# Patient Record
Sex: Female | Born: 1950 | Race: White | Hispanic: No | Marital: Married | Smoking: Never smoker
Health system: Southern US, Community
[De-identification: ages and names within clinical notes are randomized; demographics above are authoritative.]

## PROBLEM LIST (undated history)

## (undated) DIAGNOSIS — F419 Anxiety disorder, unspecified: Secondary | ICD-10-CM

## (undated) DIAGNOSIS — I10 Essential (primary) hypertension: Secondary | ICD-10-CM

## (undated) DIAGNOSIS — F329 Major depressive disorder, single episode, unspecified: Secondary | ICD-10-CM

## (undated) DIAGNOSIS — F32A Depression, unspecified: Secondary | ICD-10-CM

## (undated) HISTORY — PX: CHOLECYSTECTOMY: SHX55

## (undated) HISTORY — PX: TUBAL LIGATION: SHX77

---

## 2001-04-30 ENCOUNTER — Other Ambulatory Visit: Admission: RE | Admit: 2001-04-30 | Discharge: 2001-04-30 | Payer: Self-pay | Admitting: Obstetrics and Gynecology

## 2003-01-26 ENCOUNTER — Emergency Department (HOSPITAL_COMMUNITY): Admission: EM | Admit: 2003-01-26 | Discharge: 2003-01-26 | Payer: Self-pay | Admitting: Emergency Medicine

## 2003-04-14 ENCOUNTER — Emergency Department (HOSPITAL_COMMUNITY): Admission: EM | Admit: 2003-04-14 | Discharge: 2003-04-14 | Payer: Self-pay | Admitting: Emergency Medicine

## 2003-04-19 ENCOUNTER — Emergency Department (HOSPITAL_COMMUNITY): Admission: RE | Admit: 2003-04-19 | Discharge: 2003-04-19 | Payer: Self-pay | Admitting: Internal Medicine

## 2004-09-12 ENCOUNTER — Emergency Department: Payer: Self-pay | Admitting: Emergency Medicine

## 2004-11-13 ENCOUNTER — Other Ambulatory Visit: Payer: Self-pay

## 2004-11-13 ENCOUNTER — Emergency Department: Payer: Self-pay | Admitting: Emergency Medicine

## 2005-01-08 ENCOUNTER — Emergency Department: Payer: Self-pay | Admitting: Emergency Medicine

## 2005-01-08 ENCOUNTER — Other Ambulatory Visit: Payer: Self-pay

## 2005-07-24 ENCOUNTER — Emergency Department: Payer: Self-pay | Admitting: Emergency Medicine

## 2005-11-03 ENCOUNTER — Ambulatory Visit: Payer: Self-pay | Admitting: *Deleted

## 2006-12-08 ENCOUNTER — Emergency Department (HOSPITAL_COMMUNITY): Admission: EM | Admit: 2006-12-08 | Discharge: 2006-12-08 | Payer: Self-pay | Admitting: Emergency Medicine

## 2007-06-03 ENCOUNTER — Emergency Department (HOSPITAL_COMMUNITY): Admission: EM | Admit: 2007-06-03 | Discharge: 2007-06-03 | Payer: Self-pay | Admitting: Emergency Medicine

## 2007-11-08 ENCOUNTER — Emergency Department (HOSPITAL_COMMUNITY): Admission: EM | Admit: 2007-11-08 | Discharge: 2007-11-08 | Payer: Self-pay | Admitting: Emergency Medicine

## 2008-03-26 IMAGING — CR DG WRIST COMPLETE 3+V*L*
2 series · 2 of 2 positions shown · non-contrast
Comparison: none

CLINICAL DATA: Left wrist injury.  Injured four days ago.
LEFT WRIST ? 4 VIEW:

[view not recorded (1 of 2)]
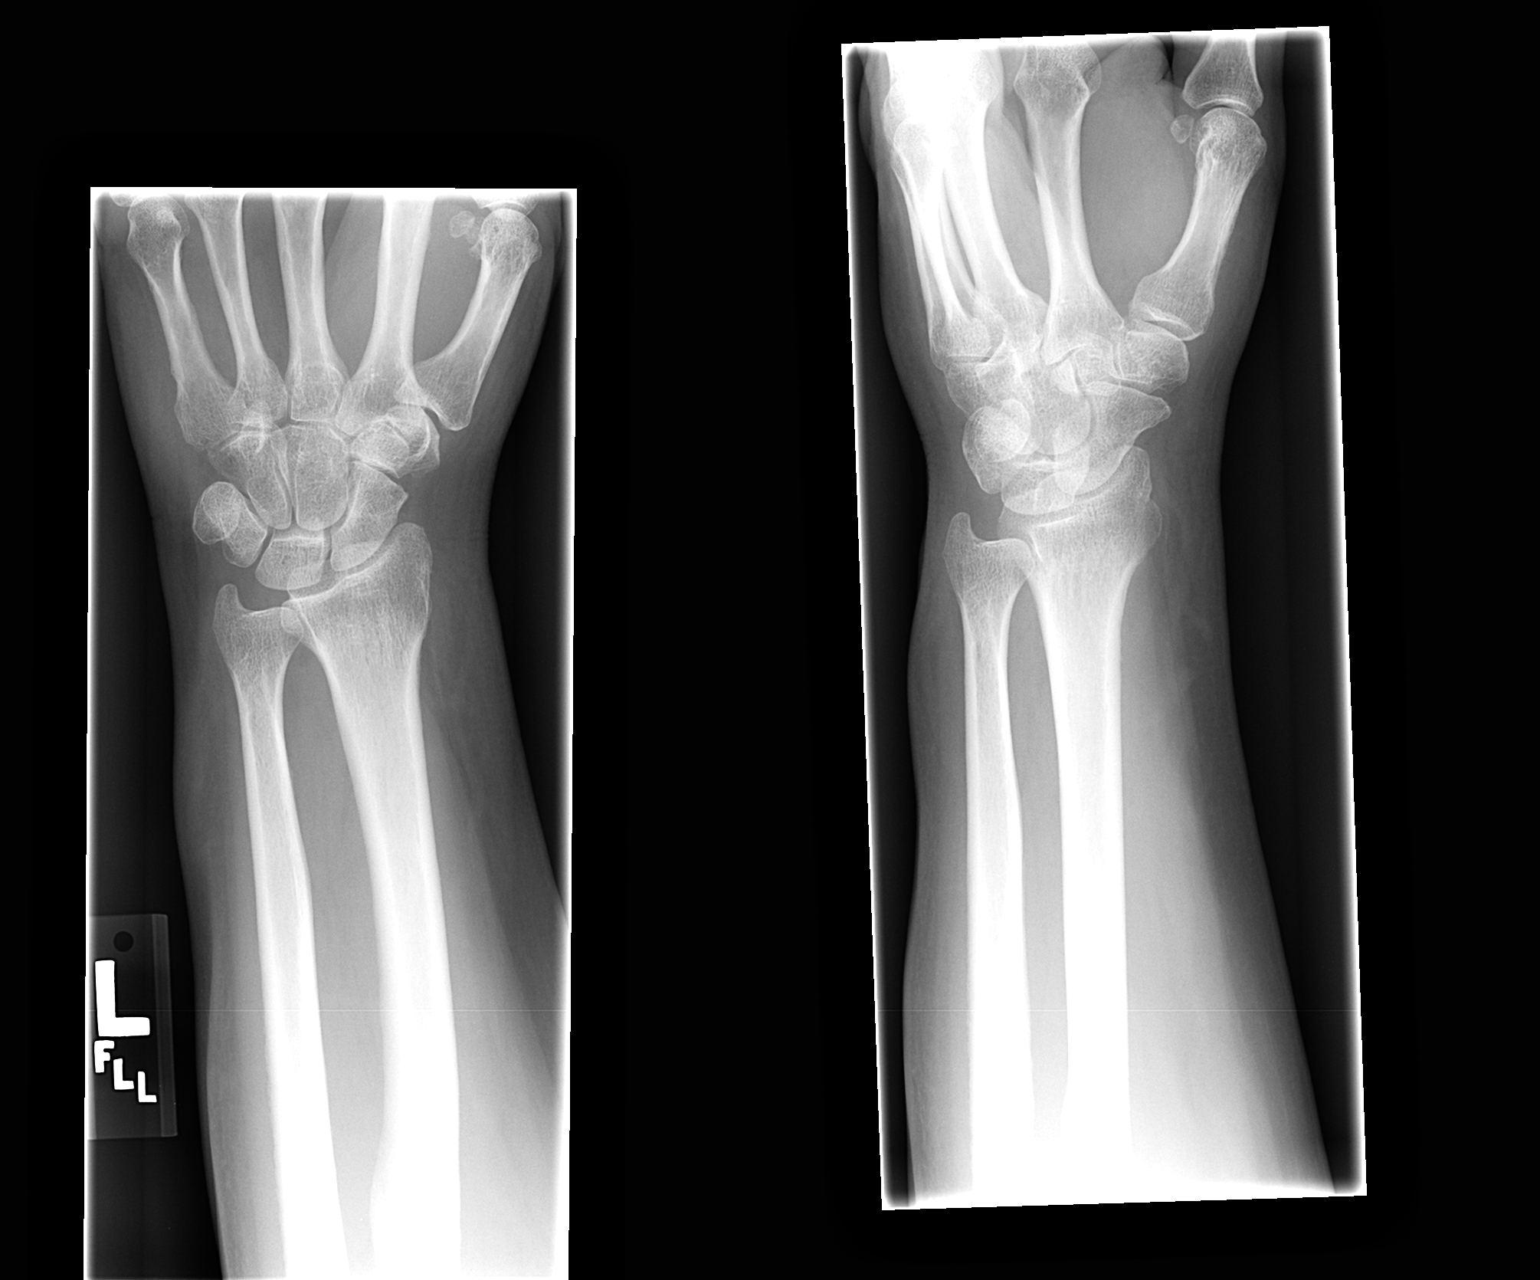

[view not recorded (2 of 2)]
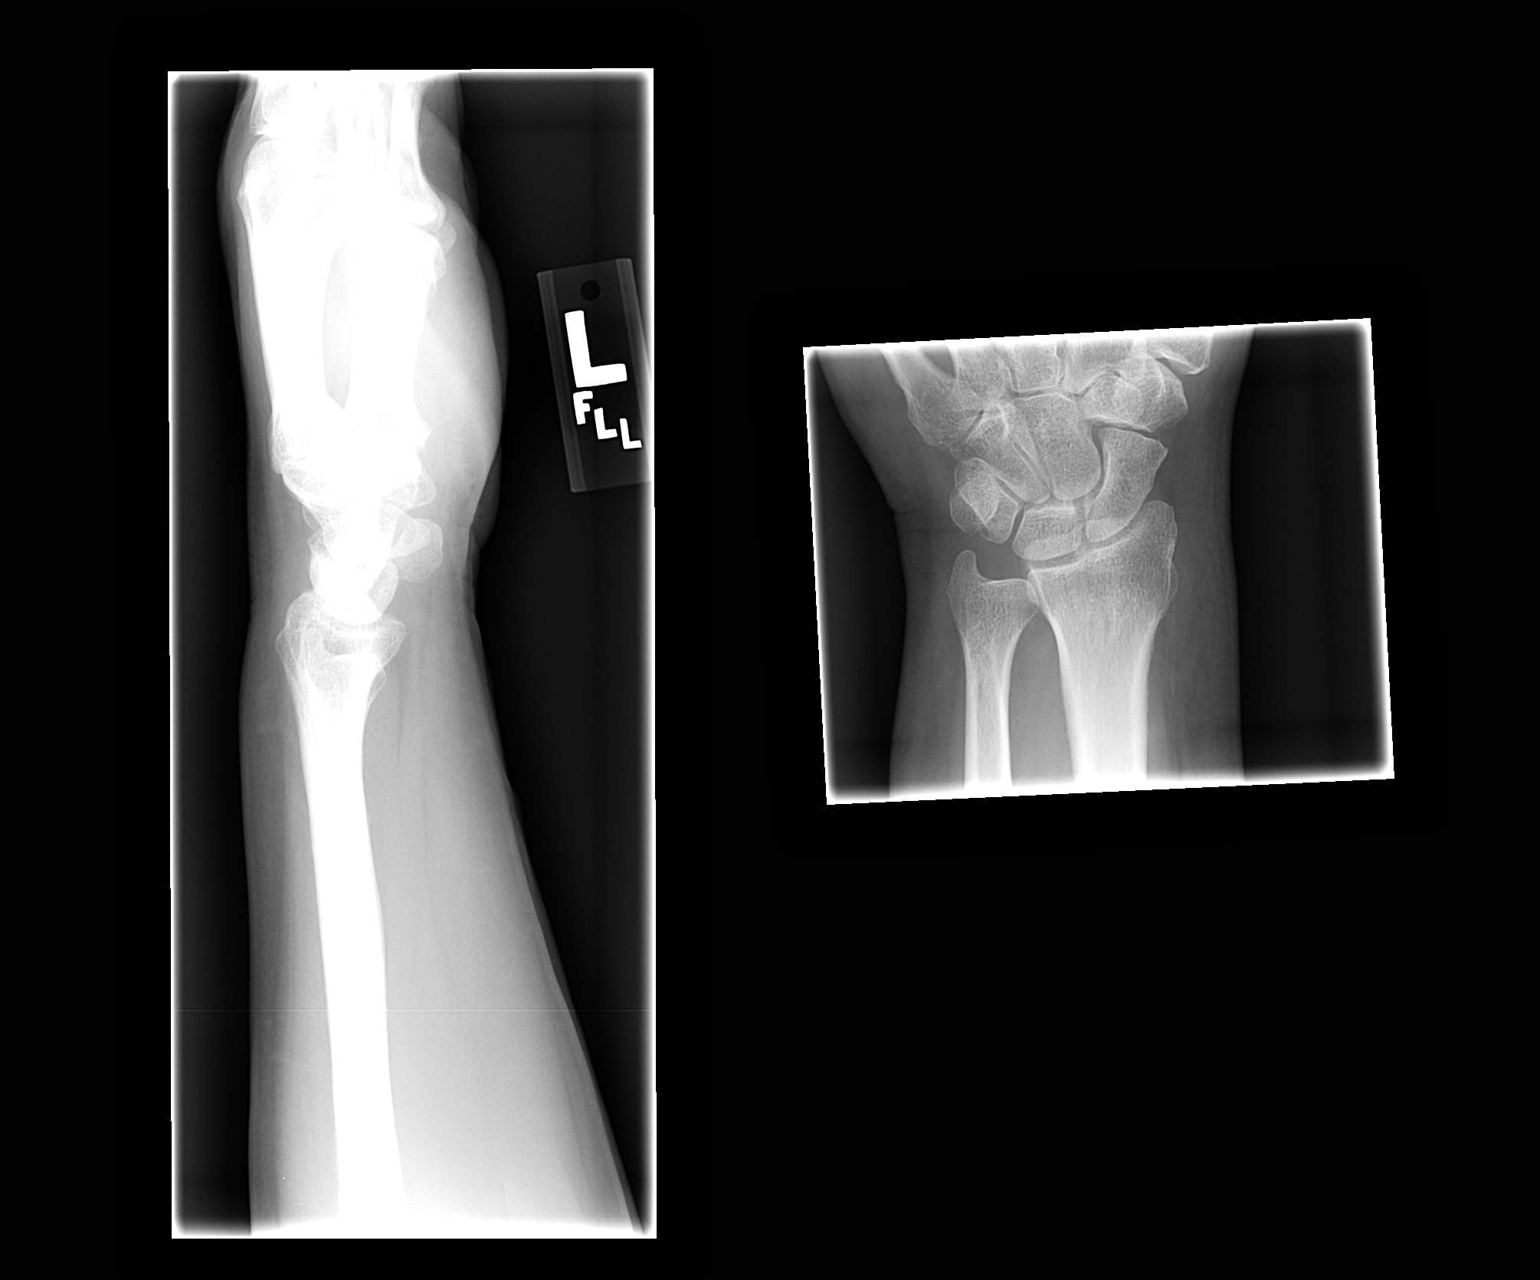

[2 of 2 positions shown; findings below may reference images not displayed]

FINDINGS: Four views of the left wrist demonstrate no acute fracture.  There is mild prominence of the pronator fat pad.  Particular attention is directed to the navicular.
IMPRESSION: No evidence of acute fracture.  Recommend followup plain films of the left wrist in 4-6 days if continued pain.

## 2010-01-11 ENCOUNTER — Emergency Department: Payer: Self-pay | Admitting: Emergency Medicine

## 2010-04-21 ENCOUNTER — Encounter: Payer: Self-pay | Admitting: Obstetrics & Gynecology

## 2010-04-21 ENCOUNTER — Encounter: Payer: Self-pay | Admitting: Internal Medicine

## 2010-05-29 ENCOUNTER — Emergency Department: Payer: Self-pay | Admitting: Emergency Medicine

## 2010-11-20 ENCOUNTER — Ambulatory Visit: Payer: Self-pay | Admitting: Internal Medicine

## 2012-03-08 IMAGING — US US THYROID
1 series · 17 of 25 positions shown · non-contrast
Comparison: none

REASON FOR EXAM: Hyperthyroidism
COMMENTS:

[Series 1: us thyroid · 17 of 43 slices shown]
[im 1/43]
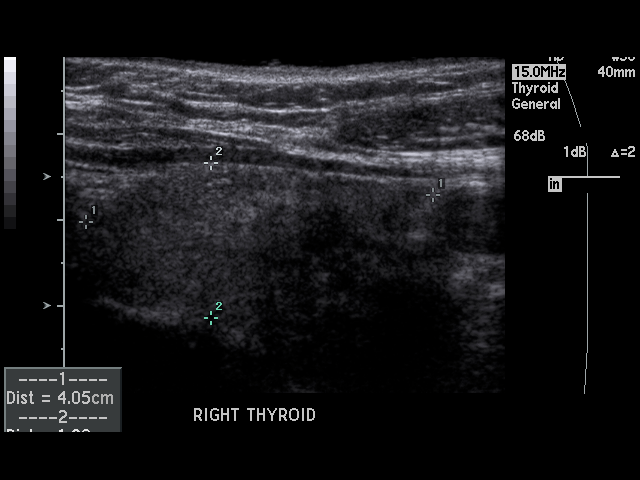
[im 4/43]
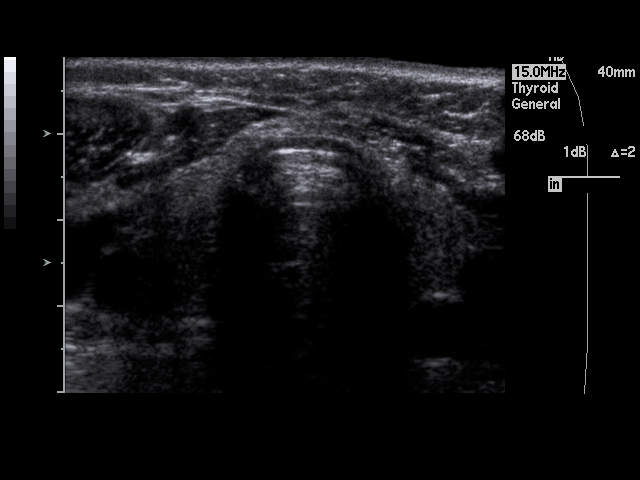
[im 6/43]
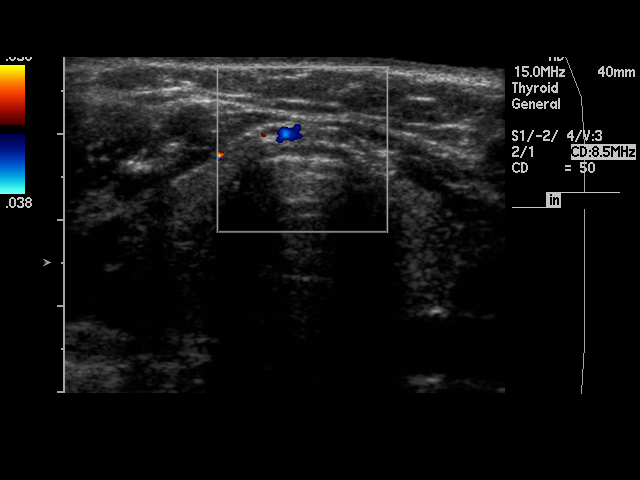
[im 9/43]
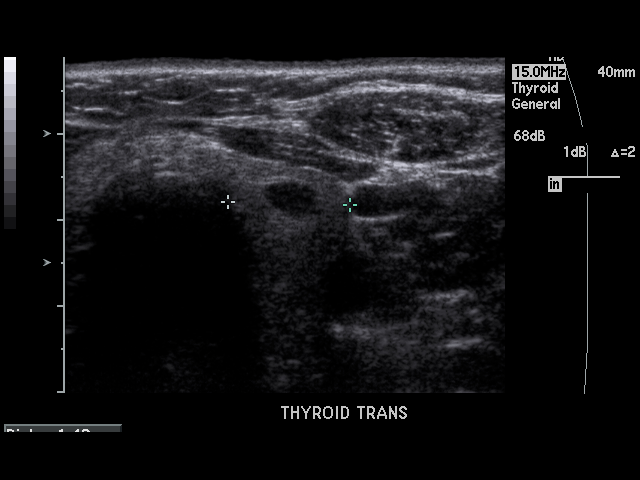
[im 11/43]
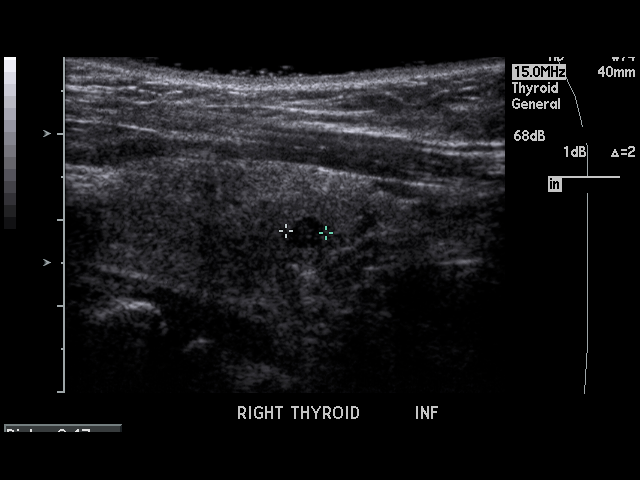
[im 15/43]
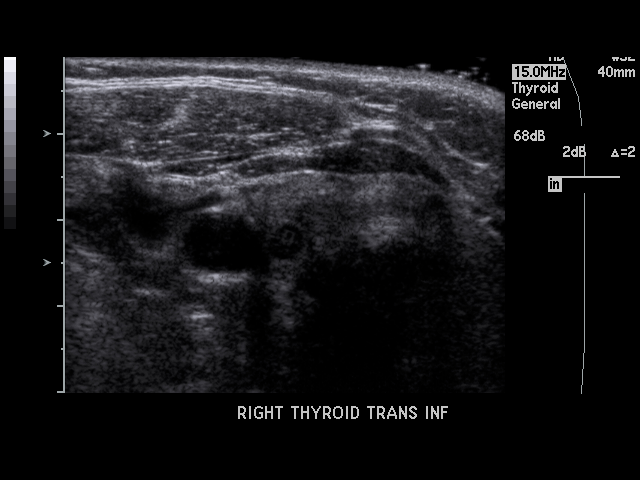
[im 16/43]
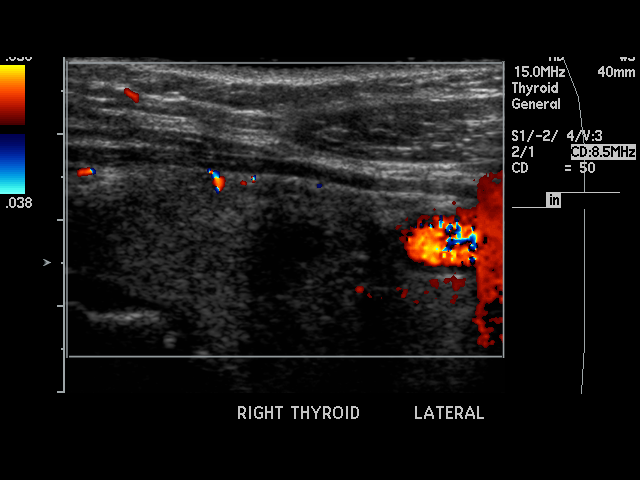
[im 20/43]
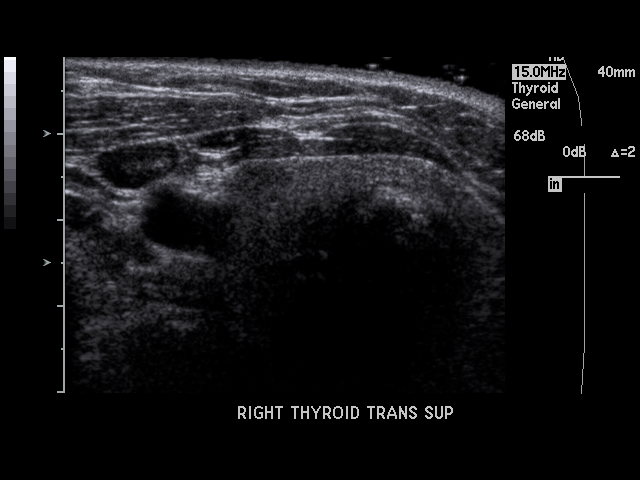
[im 22/43]
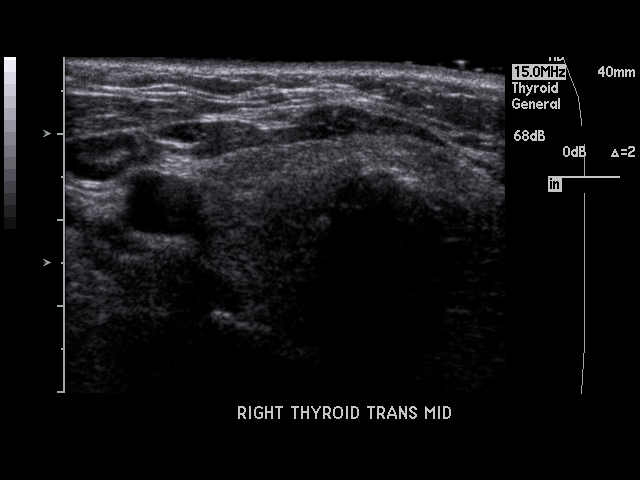
[im 23/43]
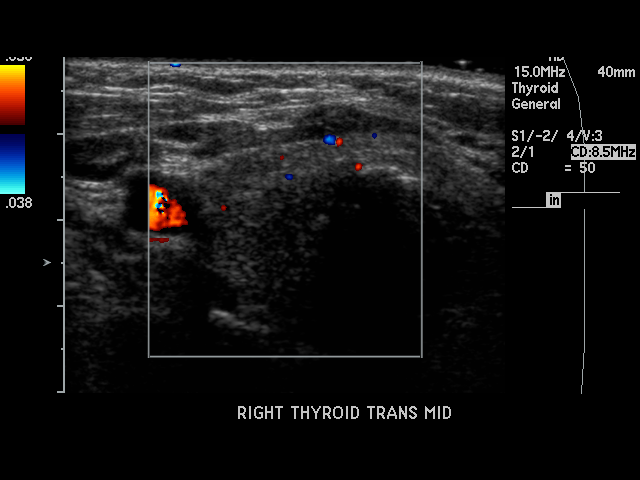
[im 27/43]
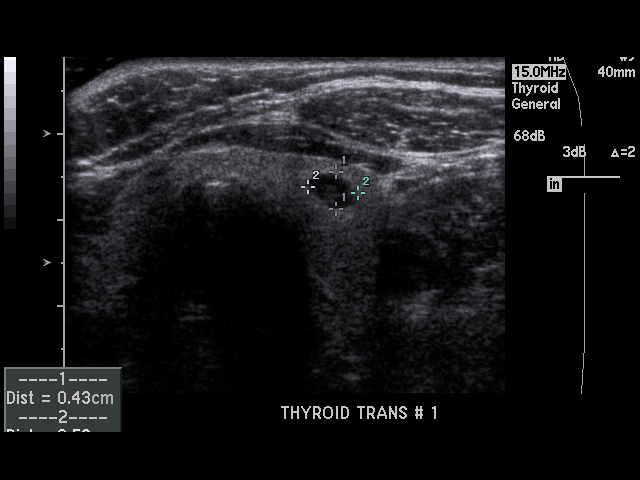
[im 29/43]
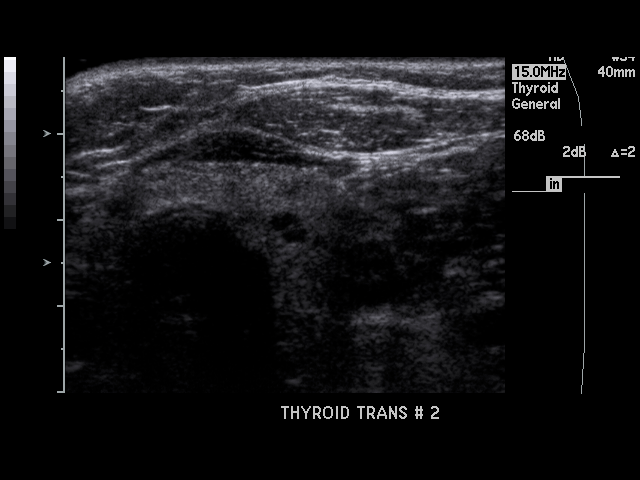
[im 32/43]
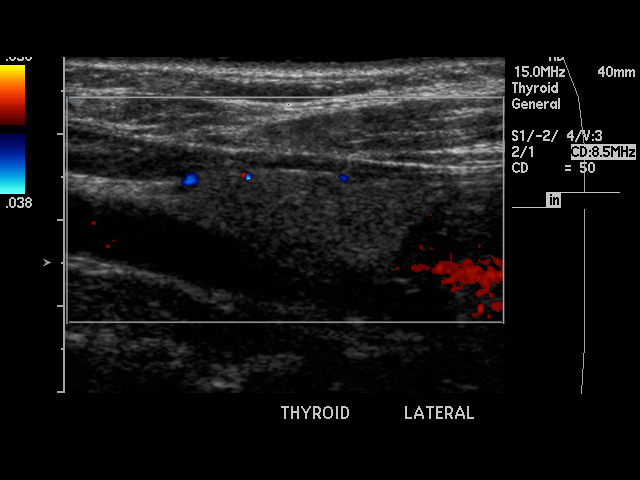
[im 34/43]
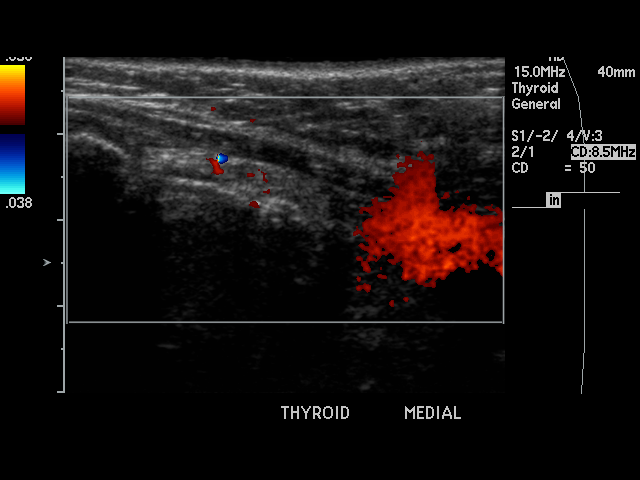
[im 37/43]
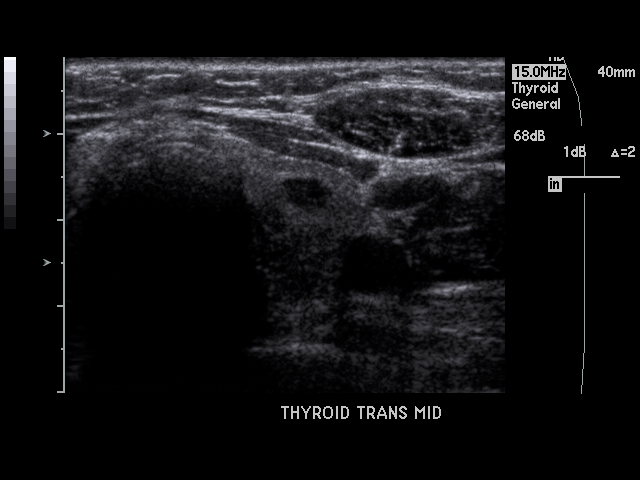
[im 39/43]
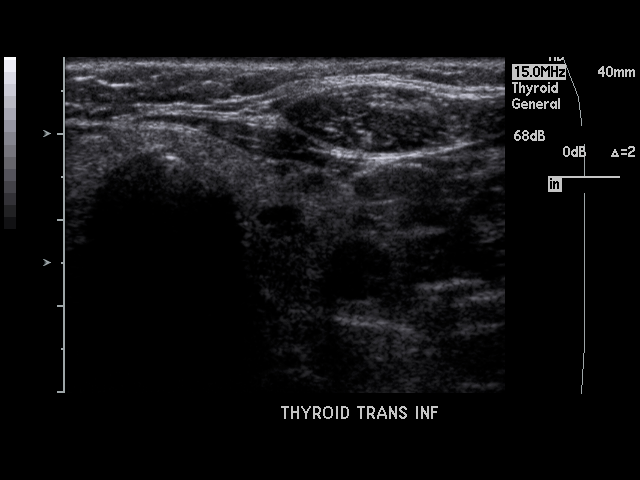
[im 43/43]
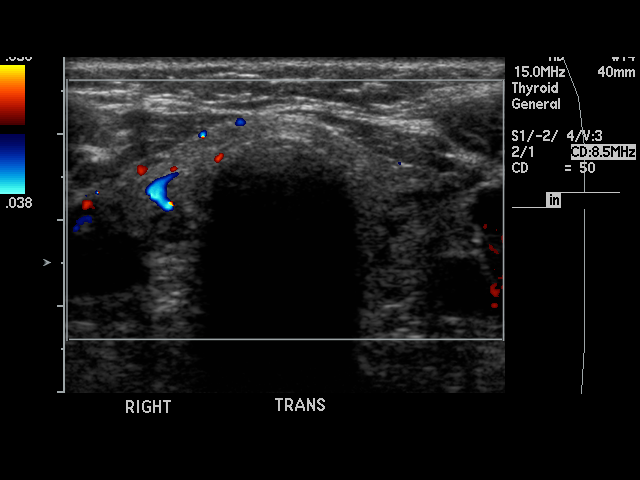

[17 of 25 positions shown; findings below may reference images not displayed]

PROCEDURE:     LETLHABILE - LETLHABILE THYROID  - November 20, 2010  [DATE]

RESULT:     The right lobe of the thyroid measures 4.05 cm x 1.8 cm x
cm and the left lobe measures 3.54 cm x 1.43 cm x 1.42 cm. In the lower pole
of the right lobe, there is a 4.7 mm, smoothly marginated, hypoechoic
nodule. In the left lobe, there are noted two, adjacent, smoothly
marginated, hypoechoic nodules near the junction of the mid pole and lower
pole. The larger measures 6.1 mm in diameter and the smaller measures 4.9 mm
in diameter. No additional masses or nodules are seen. No thyroid
calcifications are identified.
IMPRESSION: There are noted subcentimeter hypoechoic, smoothly marginated
nodules bilaterally as noted above.

## 2012-10-05 ENCOUNTER — Emergency Department: Payer: Self-pay | Admitting: Unknown Physician Specialty

## 2012-10-05 LAB — URINALYSIS, COMPLETE
Bacteria: NONE SEEN
Bilirubin,UR: NEGATIVE
Glucose,UR: NEGATIVE mg/dL (ref 0–75)
Leukocyte Esterase: NEGATIVE
Nitrite: NEGATIVE
Ph: 6 (ref 4.5–8.0)
Protein: NEGATIVE
RBC,UR: 1 /HPF (ref 0–5)
Specific Gravity: 1.006 (ref 1.003–1.030)
WBC UR: <1 /HPF (ref 0–5)

## 2012-10-05 LAB — CBC WITH DIFFERENTIAL/PLATELET
Basophil #: 0.1 10*3/uL (ref 0.0–0.1)
Basophil %: 0.8 %
Eosinophil %: 1.8 %
HCT: 43.6 % (ref 35.0–47.0)
Lymphocyte #: 2.9 10*3/uL (ref 1.0–3.6)
Lymphocyte %: 27.3 %
MCH: 30 pg (ref 26.0–34.0)
MCHC: 34.4 g/dL (ref 32.0–36.0)
Monocyte #: 0.8 x10 3/mm (ref 0.2–0.9)
Monocyte %: 7.7 %
Neutrophil #: 6.6 10*3/uL — ABNORMAL HIGH (ref 1.4–6.5)
Neutrophil %: 62.4 %
RBC: 5 10*6/uL (ref 3.80–5.20)
RDW: 14.7 % — ABNORMAL HIGH (ref 11.5–14.5)
WBC: 10.5 10*3/uL (ref 3.6–11.0)

## 2012-10-05 LAB — MAGNESIUM: Magnesium: 1.8 mg/dL

## 2012-10-05 LAB — TSH: Thyroid Stimulating Horm: 1.61 u[IU]/mL

## 2012-10-05 LAB — COMPREHENSIVE METABOLIC PANEL
Alkaline Phosphatase: 106 U/L (ref 50–136)
Anion Gap: 7 (ref 7–16)
BUN: 7 mg/dL (ref 7–18)
Bilirubin,Total: 1.3 mg/dL — ABNORMAL HIGH (ref 0.2–1.0)
Calcium, Total: 9.2 mg/dL (ref 8.5–10.1)
Co2: 27 mmol/L (ref 21–32)
Creatinine: 0.57 mg/dL — ABNORMAL LOW (ref 0.60–1.30)
EGFR (African American): >60
EGFR (Non-African Amer.): >60
Glucose: 112 mg/dL — ABNORMAL HIGH (ref 65–99)
Osmolality: 274 (ref 275–301)
Potassium: 3.9 mmol/L (ref 3.5–5.1)
SGOT(AST): 36 U/L (ref 15–37)
SGPT (ALT): 44 U/L (ref 12–78)
Total Protein: 8.5 g/dL — ABNORMAL HIGH (ref 6.4–8.2)

## 2013-04-04 ENCOUNTER — Emergency Department: Payer: Self-pay | Admitting: Emergency Medicine

## 2014-07-22 IMAGING — CR DG CHEST 2V
1 series · 2 of 2 positions shown · non-contrast
Comparison: 10/05/2012

CLINICAL DATA: Productive cough for 1 week

EXAM:
CHEST  2 VIEW

[Series 1: w chest pa · 0.14mm/px · 2 of 2 slices shown]
[im 1/2]
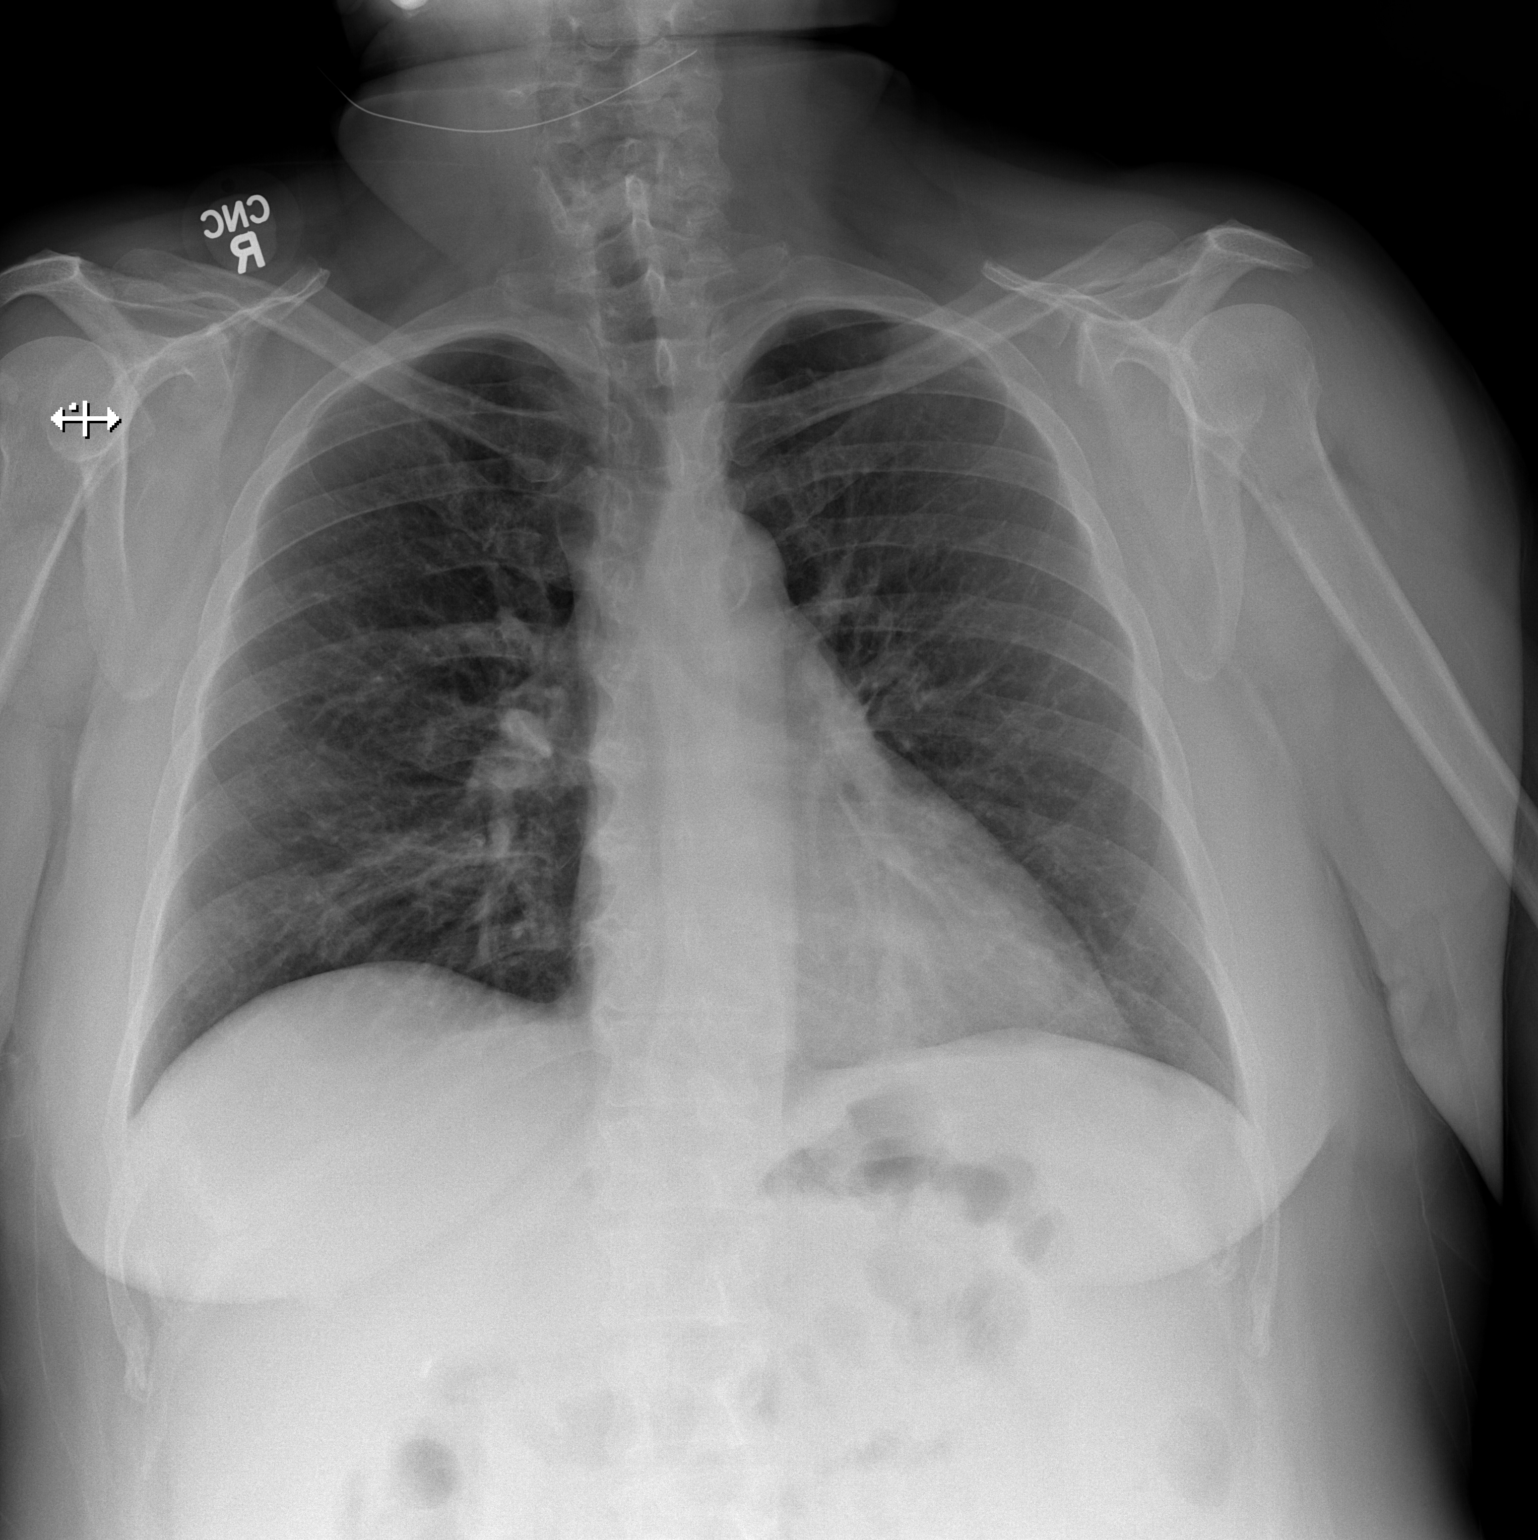
[im 2/2]
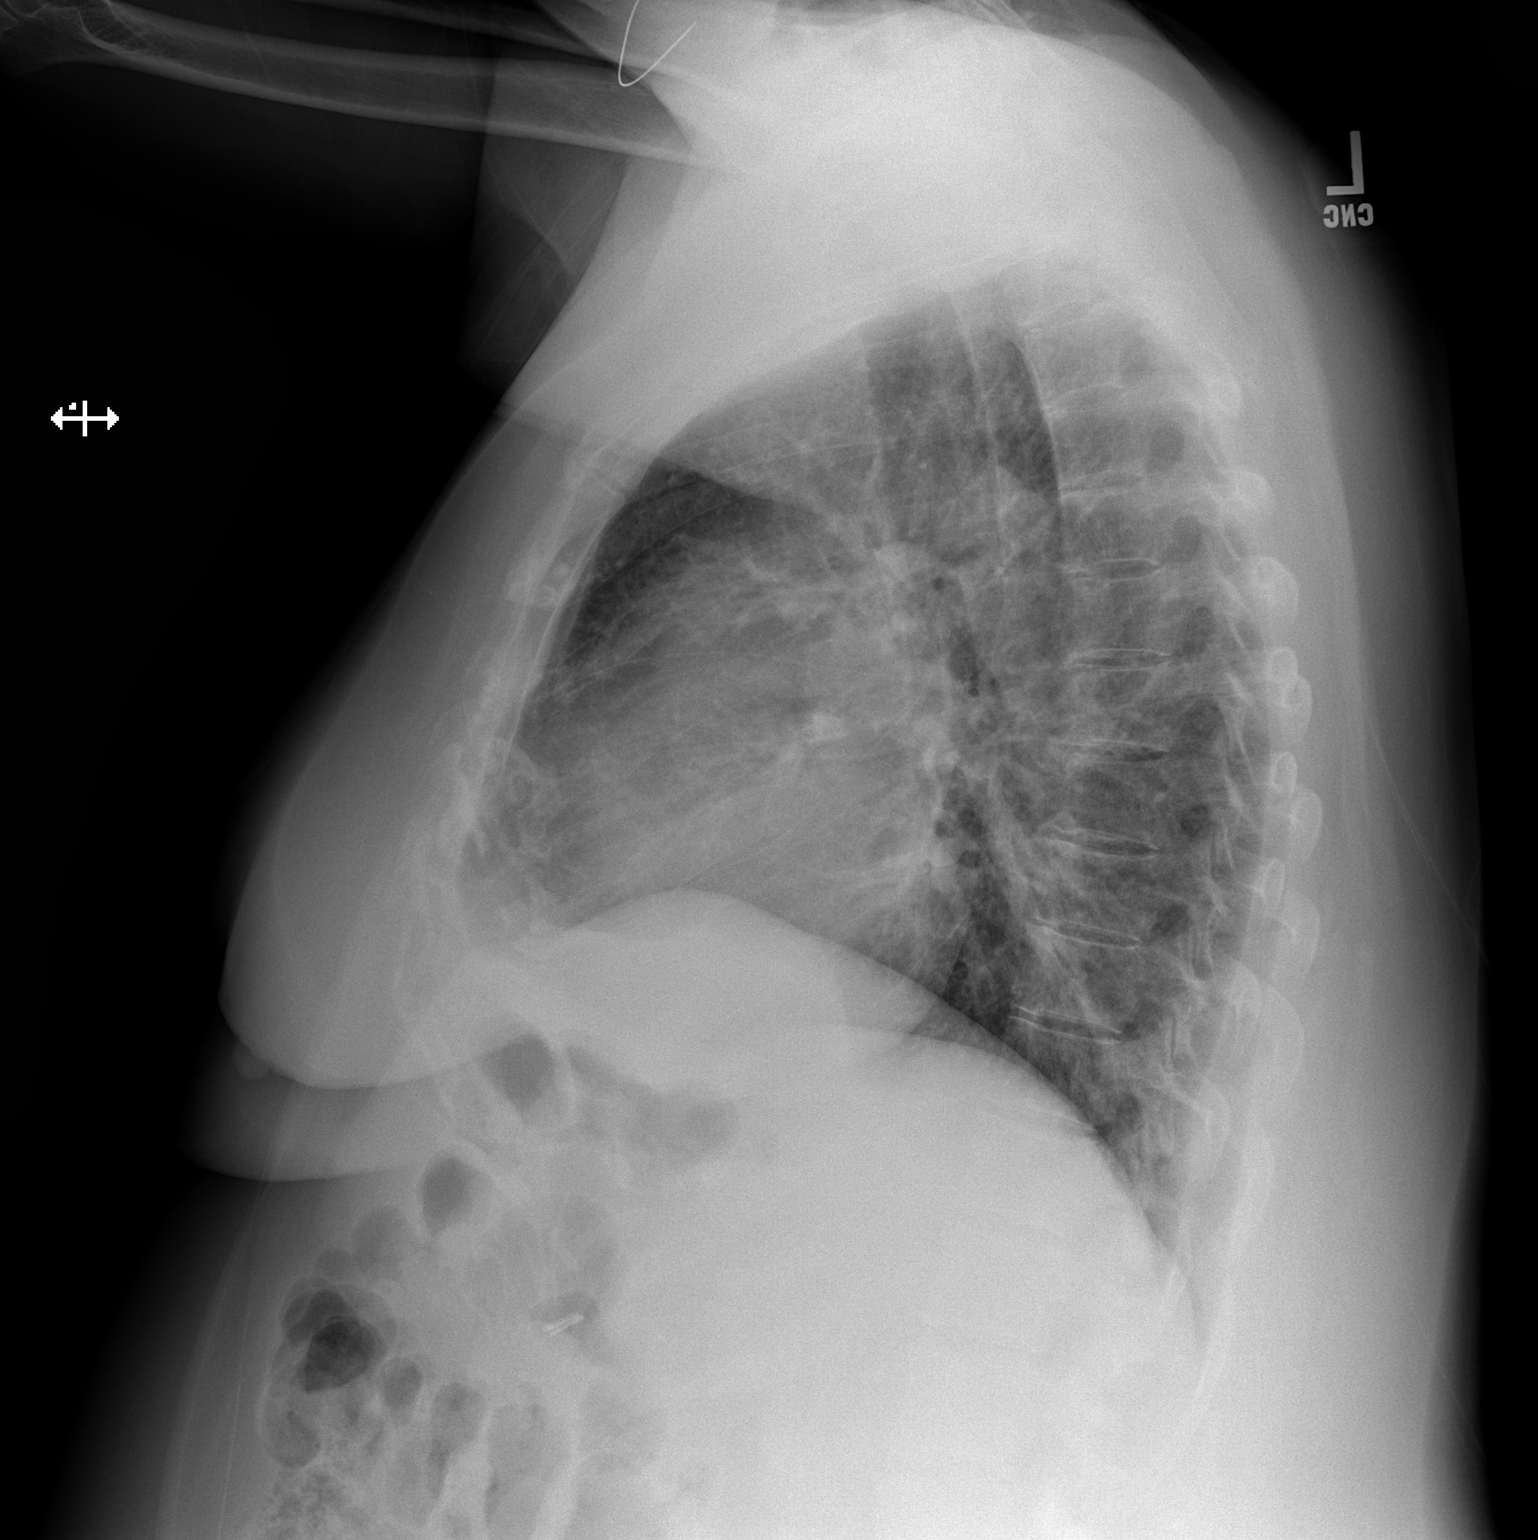

[2 of 2 positions shown; findings below may reference images not displayed]

FINDINGS: The heart size and mediastinal contours are within normal limits.
Both lungs are clear. The visualized skeletal structures are
unremarkable.
IMPRESSION: No active cardiopulmonary disease.

## 2014-09-01 ENCOUNTER — Emergency Department
Admission: EM | Admit: 2014-09-01 | Discharge: 2014-09-01 | Disposition: A | Discharge status: Home / Self Care | Payer: Medicaid Other | Attending: Emergency Medicine | Admitting: Emergency Medicine

## 2014-09-01 ENCOUNTER — Emergency Department: Payer: Medicaid Other

## 2014-09-01 DIAGNOSIS — Z792 Long term (current) use of antibiotics: Secondary | ICD-10-CM | POA: Insufficient documentation

## 2014-09-01 DIAGNOSIS — S80862A Insect bite (nonvenomous), left lower leg, initial encounter: Secondary | ICD-10-CM | POA: Diagnosis present

## 2014-09-01 DIAGNOSIS — Y9389 Activity, other specified: Secondary | ICD-10-CM | POA: Insufficient documentation

## 2014-09-01 DIAGNOSIS — L089 Local infection of the skin and subcutaneous tissue, unspecified: Secondary | ICD-10-CM | POA: Insufficient documentation

## 2014-09-01 DIAGNOSIS — I10 Essential (primary) hypertension: Secondary | ICD-10-CM | POA: Insufficient documentation

## 2014-09-01 DIAGNOSIS — W57XXXA Bitten or stung by nonvenomous insect and other nonvenomous arthropods, initial encounter: Secondary | ICD-10-CM | POA: Diagnosis not present

## 2014-09-01 DIAGNOSIS — Y998 Other external cause status: Secondary | ICD-10-CM | POA: Insufficient documentation

## 2014-09-01 DIAGNOSIS — Y9289 Other specified places as the place of occurrence of the external cause: Secondary | ICD-10-CM | POA: Insufficient documentation

## 2014-09-01 HISTORY — DX: Essential (primary) hypertension: I10

## 2014-09-01 MED ORDER — SULFAMETHOXAZOLE-TRIMETHOPRIM 800-160 MG PO TABS: 1.0000 | ORAL_TABLET | Freq: Two times a day (BID) | ORAL | Status: DC

## 2014-09-01 MED ORDER — HYDROCODONE-ACETAMINOPHEN 5-325 MG PO TABS: 1.0000 | ORAL_TABLET | ORAL | Status: DC | PRN

## 2014-09-01 NOTE — Discharge Instructions (Signed)
° °  MOIST HEAT TO AREA FREQUENTLY TAKE SEPTRA DS FOR INFECITON NORCO FOR PAIN AS NEEDED RETURN TO ER IF ANY SEVERE WORSENING OR URGENT CONCERNS.  IF ANY FEVER OF 101 OR MORE

## 2014-09-01 NOTE — ED Provider Notes (Signed)
[facility] Emergency Department Provider Note  ____________________________________________  Time seen: 1351  I have reviewed the triage vital signs and the nursing notes.   HISTORY  Chief Complaint Insect Bite    HPI Raven Schroeder is a 64 y.o. female is here today with complaint of a insect bite "spider bite" to her left upper leg. She states she noticed it yesterday today is been draining. She denies any previous problems with abscesses or skin infections. She has not had any nausea or vomiting. She is not aware of any fever. She states it does hurt but rates her pain as 3 out of 10 currently. Nothing has made it worse or better.   Past Medical History  Diagnosis Date  . Hypertension     There are no active problems to display for this patient.   Past Surgical History  Procedure Laterality Date  . Tubal ligation    . Cholecystectomy      Current Outpatient Rx  Name  Route  Sig  Dispense  Refill  . HYDROcodone-acetaminophen (NORCO/VICODIN) 5-325 MG per tablet   Oral   Take 1 tablet by mouth every 4 (four) hours as needed for moderate pain.   20 tablet   0   . sulfamethoxazole-trimethoprim (BACTRIM DS,SEPTRA DS) 800-160 MG per tablet   Oral   Take 1 tablet by mouth 2 (two) times daily.   20 tablet   0     Allergies Review of patient's allergies indicates no known allergies.  No family history on file.  Social History History  Substance Use Topics  . Smoking status: Never Smoker   . Smokeless tobacco: Never Used  . Alcohol Use: No    Review of Systems Constitutional: No fever/chills Eyes: No visual changes. ENT: No sore throat. Cardiovascular: Denies chest pain. Respiratory: Denies shortness of breath. Gastrointestinal: No abdominal pain.  No nausea, no vomiting. Genitourinary: Negative for dysuria. Musculoskeletal: Negative for back pain. Skin: Positive for rash. Neurological: Negative for headaches, focal weakness  or numbness.  10-point ROS otherwise negative.  ____________________________________________   PHYSICAL EXAM:  VITAL SIGNS: ED Triage Vitals  Enc Vitals Group     BP 09/01/14 1308 151/77 mmHg     Pulse Rate 09/01/14 1308 79     Resp 09/01/14 1308 16     Temp 09/01/14 1308 98.2 F (36.8 C)     Temp Source 09/01/14 1308 Oral     SpO2 09/01/14 1308 98 %     Weight 09/01/14 1308 155 lb (70.308 kg)     Height 09/01/14 1308 5' (1.524 m)     Head Cir --      Peak Flow --      Pain Score 09/01/14 1309 3     Pain Loc --      Pain Edu? --      Excl. in GC? --     Constitutional: Alert and oriented. Well appearing and in no acute distress. Eyes: Conjunctivae are normal. PERRL. EOMI. Head: Atraumatic. Nose: No congestion/rhinnorhea. Neck: No stridor.   Cardiovascular: Normal rate, regular rhythm. Grossly normal heart sounds.  Good peripheral circulation. Respiratory: Normal respiratory effort.  No retractions. Lungs CTAB. Gastrointestinal: Soft and nontender. No distention. No abdominal bruits. No CVA tenderness. Musculoskeletal: No lower extremity tenderness nor edema.  No joint effusions. Neurologic:  Normal speech and language. No gross focal neurologic deficits are appreciated. Speech is normal. No gait instability. Skin:  Skin is warm, dry and intact.   left  upper posterior thigh with single cluster of pustules with some erythema. At present there is no draining. Remainder of attack and was checked to make sure that there is no other outbreaks and was negative. Psychiatric: Mood and affect are normal. Speech and behavior are normal.  ____________________________________________   LABS (all labs ordered are listed, but only abnormal results are displayed)  Labs Reviewed - No data to display  PROCEDURES  Procedure(s) performed: None  Critical Care performed: No  ____________________________________________   INITIAL IMPRESSION / ASSESSMENT AND PLAN / ED  COURSE  Pertinent labs & imaging results that were available during my care of the patient were reviewed by me and considered in my medical decision making (see chart for details).  Patient was started on Bactrim DS and Norco for pain. Patient is returned to the emergency room if any severe worsening urgent concerns. ____________________________________________   FINAL CLINICAL IMPRESSION(S) / ED DIAGNOSES  Final diagnoses:  Insect bite of leg, infected, left, initial encounter      Tommi RumpsRhonda L Summers, PA-C 09/01/14 1543  Phineas SemenGraydon Goodman, MD 09/01/14 1556

## 2014-09-01 NOTE — ED Notes (Signed)
Pt c/o spider bite to the posterior left upper leg that she noticed yesterday..states it is draining

## 2014-10-01 ENCOUNTER — Emergency Department
Admission: EM | Admit: 2014-10-01 | Discharge: 2014-10-01 | Disposition: A | Discharge status: Home / Self Care | Payer: Medicaid Other | Attending: Emergency Medicine | Admitting: Emergency Medicine

## 2014-10-01 DIAGNOSIS — I10 Essential (primary) hypertension: Secondary | ICD-10-CM | POA: Insufficient documentation

## 2014-10-01 DIAGNOSIS — Z792 Long term (current) use of antibiotics: Secondary | ICD-10-CM | POA: Insufficient documentation

## 2014-10-01 DIAGNOSIS — F419 Anxiety disorder, unspecified: Secondary | ICD-10-CM | POA: Insufficient documentation

## 2014-10-01 MED ORDER — AMITRIPTYLINE HCL 25 MG PO TABS
25.0000 mg | ORAL_TABLET | Freq: Every day | ORAL | Status: DC
  Administered 2014-10-01: 25 mg via ORAL
  Filled 2014-10-01 (×2): qty 1

## 2014-10-01 NOTE — Discharge Instructions (Signed)
Panic Attacks Panic attacks are sudden, short feelings of great fear or discomfort. You may have them for no reason when you are relaxed, when you are uneasy (anxious), or when you are sleeping.  HOME CARE  Take all your medicines as told.  Check with your doctor before starting new medicines.  Keep all doctor visits. GET HELP IF:  You are not able to take your medicines as told.  Your symptoms do not get better.  Your symptoms get worse. GET HELP RIGHT AWAY IF:  Your attacks seem different than your normal attacks.  You have thoughts about hurting yourself or others.  You take panic attack medicine and you have a side effect. MAKE SURE YOU:  Understand these instructions.  Will watch your condition.  Will get help right away if you are not doing well or get worse. Document Released: 04/19/2010 Document Revised: 01/05/2013 Document Reviewed: 10/29/2012 [facility] Patient Information 2015 [place]. This information is not intended to replace advice given to you by your health care provider. Make sure you discuss any questions you have with your health care provider. Re start the antidepressant. Follow up with your doctor. Find out from the pharmacy what your antihypertensive was so you can get a refill for that too. Return for any problems.

## 2014-10-01 NOTE — ED Notes (Signed)
Pt arrived reporting being out of amitriptyline medication for the past two months. Pt has been reportedly taking benadryl as a substitute but feels that is no longer helping. Pt feels as though anxiety levels have been getting worse and worse. Pt reports being unable to go to primary care MD due to outstanding fees. Pt in no acute distress upon assessment and denies any other complaints. Pt reports taking 100 mg amitriptyline once a day at night.

## 2014-10-01 NOTE — ED Notes (Signed)
Patient reports she is out of her anxiety medication and because she owes $25 to the doctor they will not see her.  Patient reports intermittent times of heart racing, and head pounding.

## 2014-10-01 NOTE — ED Provider Notes (Signed)
[facility] Emergency Department Provider Note  ____________________________________________  Time seen: Approximately 9:55 PM  I have reviewed the triage vital signs and the nursing notes.   HISTORY  Chief Complaint Anxiety   HPI Raven Schroeder is a 64 y.o. female she reports she's been off her amitriptyline for 2 months and is now having return of the symptoms she had before starting and she is having tingling and trembling in her arms and legs muscle tightness in her neck and feeling anxious. She is also out of her antihypertensives she does not know what that is. She says that she does her doctor $25 and he won't see her until she pays it. She has Medicaid and can't afford her medicines. She denies any other problems she otherwise feels well.   Past Medical History  Diagnosis Date  . Hypertension     There are no active problems to display for this patient.   Past Surgical History  Procedure Laterality Date  . Tubal ligation    . Cholecystectomy      Current Outpatient Rx  Name  Route  Sig  Dispense  Refill  . HYDROcodone-acetaminophen (NORCO/VICODIN) 5-325 MG per tablet   Oral   Take 1 tablet by mouth every 4 (four) hours as needed for moderate pain.   20 tablet   0   . sulfamethoxazole-trimethoprim (BACTRIM DS,SEPTRA DS) 800-160 MG per tablet   Oral   Take 1 tablet by mouth 2 (two) times daily.   20 tablet   0     Allergies Codeine  No family history on file.  Social History History  Substance Use Topics  . Smoking status: Never Smoker   . Smokeless tobacco: Never Used  . Alcohol Use: No    Review of Systems Constitutional: No fever/chills Eyes: No visual changes. ENT: No sore throat. Cardiovascular: Denies chest pain. Respiratory: Denies shortness of breath. Gastrointestinal: No abdominal pain.  No nausea, no vomiting.  No diarrhea.  No constipation. Genitourinary: Negative for dysuria. Musculoskeletal: Negative  for back pain. Skin: Negative for rash. Neurological: Negative for headaches, focal weakness or numbness.  10-point ROS otherwise negative.  ____________________________________________   PHYSICAL EXAM:  VITAL SIGNS: ED Triage Vitals  Enc Vitals Group     BP 10/01/14 2100 157/64 mmHg     Pulse Rate 10/01/14 2100 77     Resp 10/01/14 2100 18     Temp 10/01/14 2100 98.1 F (36.7 C)     Temp Source 10/01/14 2100 Oral     SpO2 10/01/14 2100 98 %     Weight 10/01/14 2100 160 lb (72.576 kg)     Height 10/01/14 2100 5' (1.524 m)     Head Cir --      Peak Flow --      Pain Score 10/01/14 2106 0     Pain Loc --      Pain Edu? --      Excl. in GC? --     Constitutional: Alert and oriented. Well appearing and in no acute distress. Eyes: Conjunctivae are normal. PERRL. EOMI. Head: Atraumatic. Nose: No congestion/rhinnorhea. Mouth/Throat: Mucous membranes are moist.  Oropharynx non-erythematous. Neck: No stridor.  Cardiovascular: Normal rate, regular rhythm. Grossly normal heart sounds.  Good peripheral circulation. Respiratory: Normal respiratory effort.  No retractions. Lungs CTAB. Gastrointestinal: Soft and nontender. No distention. No abdominal bruits. No CVA tenderness. Musculoskeletal: No lower extremity tenderness nor edema.  No joint effusions. Neurologic:  Normal speech and language. No  gross focal neurologic deficits are appreciated. Speech is normal. No gait instability. Skin:  Skin is warm, dry and intact. No rash noted. Psychiatric: Mood and affect are normal. Speech and behavior are normal.  ____________________________________________   LABS (all labs ordered are listed, but only abnormal results are displayed)  Labs Reviewed - No data to  display ____________________________________________  EKG   ____________________________________________  RADIOLOGY   ____________________________________________   PROCEDURES    ____________________________________________   INITIAL IMPRESSION / ASSESSMENT AND PLAN / ED COURSE  Pertinent labs & imaging results that were available during my care of the patient were reviewed by me and considered in my medical decision making (see chart for details).  ____________________________________________   FINAL CLINICAL IMPRESSION(S) / ED DIAGNOSES  Final diagnoses:  Anxiety      Arnaldo Natal, MD 10/01/14 2238

## 2014-10-02 NOTE — BH Assessment (Signed)
Assessment Note  Raven Schroeder is an 64 y.o. female., who presents to the ED for c/o, "I drove myself here; I ran out of my anxiety medicine(amitriptyline 100 mgs) two months ago; I could not go to see my doctor because, i owe $25.00; so, I have been taking 50-75 mgs of benadryl a night; now that don't work; I was having anxiety; that, I did not want to go into a panic attack; I have bad panic attacks. I just want to get some of the sleep medication; I'm not suicidal or anything." Referral to [facility] provided.  Axis I: Generalized Anxiety Disorder and Panic Disorder Axis II: Deferred Axis III:  Past Medical History  Diagnosis Date  . Hypertension    Axis IV: economic problems, problems with access to health care services and problems with primary support group Axis V: 61-70 mild symptoms  Past Medical History:  Past Medical History  Diagnosis Date  . Hypertension     Past Surgical History  Procedure Laterality Date  . Tubal ligation    . Cholecystectomy      Family History: No family history on file.  Social History:  reports that she has never smoked. She has never used smokeless tobacco. She reports that she does not drink alcohol or use illicit drugs.  Additional Social History:     CIWA: CIWA-Ar BP: (!) 155/83 mmHg Pulse Rate: 73 COWS:    Allergies:  Allergies  Allergen Reactions  . Codeine Other (See Comments)    Gi upset    Home Medications:  (Not in a hospital admission)  OB/GYN Status:  No LMP recorded. Patient is postmenopausal.  General Assessment Data [facility] Assessment: In system Is this a Tele or Face-to-Face Assessment?: Face-to-Face Is this an Initial Assessment or a Re-assessment for this encounter?: Re-Assessment Marital status: Married Is patient pregnant?: No Pregnancy Status: No Living Arrangements: Spouse/significant other Can pt return to current living arrangement?: Yes Admission Status: Voluntary Is  patient capable of signing voluntary admission?: Yes Referral Source: Self/Family/Friend Insurance type: [place] Medicaid  Medical Screening Exam [facility]-in ONLY) Medical Exam completed: Yes  Crisis Care Plan Living Arrangements: Spouse/significant other Name of Psychiatrist: none Name of Therapist: none  Education Status Is patient currently in school?: No Current Grade: n/a Highest grade of school patient has completed: 10th Name of school: n/a Contact person: husband  Risk to self with the past 6 months Suicidal Ideation: No Has patient been a risk to self within the past 6 months prior to admission? : No Suicidal Intent: No Has patient had any suicidal intent within the past 6 months prior to admission? : No Is patient at risk for suicide?: No Suicidal Plan?: No Has patient had any suicidal plan within the past 6 months prior to admission? : No Access to Means: No What has been your use of drugs/alcohol within the last 12 months?: none Previous Attempts/Gestures: No How many times?: 0 Other Self Harm Risks: 0 Triggers for Past Attempts: None known Intentional Self Injurious Behavior: None Family Suicide History: No Recent stressful life event(s): Other (Comment) (ran out of medication) Persecutory voices/beliefs?: No Depression: No Depression Symptoms:  ("I have anxiety.") Substance abuse history and/or treatment for substance abuse?: No Suicide prevention information given to non-admitted patients: Not applicable  Risk to Others within the past 6 months Homicidal Ideation: No Does patient have any lifetime risk of violence toward others beyond the six months prior to admission? : No Thoughts of  Harm to Others: No Current Homicidal Intent: No Current Homicidal Plan: No Access to Homicidal Means: No Identified Victim: none History of harm to others?: No Assessment of Violence: On admission Violent Behavior Description: none Does patient have access to weapons?:  No Criminal Charges Pending?: No Does patient have a court date: No Is patient on probation?: No  Psychosis Hallucinations: None noted Delusions: None noted  Mental Status Report Appearance/Hygiene: Unremarkable Eye Contact: Good Motor Activity: Unremarkable Speech: Logical/coherent Level of Consciousness: Quiet/awake, Alert Mood: Anxious Affect: Anxious Anxiety Level: Moderate Thought Processes: Coherent, Relevant, Circumstantial Judgement: Unimpaired Orientation: Person, Place, Situation, Appropriate for developmental age Obsessive Compulsive Thoughts/Behaviors: Moderate  Cognitive Functioning Concentration: Normal Memory: Recent Intact, Remote Intact IQ: Average Insight: Fair Impulse Control: Fair Appetite: Fair Weight Loss: 0 Weight Gain: 0 Sleep: Decreased Total Hours of Sleep: 4 Vegetative Symptoms: None  ADLScreening [facility] Assessment Services) Patient's cognitive ability adequate to safely complete daily activities?: Yes Patient able to express need for assistance with ADLs?: Yes Independently performs ADLs?: Yes (appropriate for developmental age)  Prior Inpatient Therapy Prior Inpatient Therapy: No  Prior Outpatient Therapy Prior Outpatient Therapy: No Does patient have an ACCT team?: No Does patient have Intensive In-House Services?  : No Does patient have [facility] services? : No Does patient have P4CC services?: No  ADL Screening (condition at time of admission) Patient's cognitive ability adequate to safely complete daily activities?: Yes Patient able to express need for assistance with ADLs?: Yes Independently performs ADLs?: Yes (appropriate for developmental age)       Abuse/Neglect Assessment (Assessment to be complete while patient is alone) Physical Abuse: Denies Verbal Abuse: Denies Sexual Abuse: Denies Exploitation of patient/patient's resources: Denies Self-Neglect: Denies Values / Beliefs Cultural Requests During Hospitalization:  None Spiritual Requests During Hospitalization: None Consults Spiritual Care Consult Needed: No Social Work Consult Needed: No Merchant navy officer (For Healthcare) Does patient have an advance directive?: No    Additional Information 1:1 In Past 12 Months?: No CIRT Risk: No Elopement Risk: No Does patient have medical clearance?: Yes  Child/Adolescent Assessment Running Away Risk: Denies Bed-Wetting: Denies Destruction of Property: Denies Cruelty to Animals: Denies Stealing: Denies Rebellious/Defies Authority: Denies Satanic Involvement: Denies Archivist: Denies Problems at [employer]: Denies Gang Involvement: Denies  Disposition:  Disposition Initial Assessment Completed for this Encounter: Yes Disposition of Patient: Outpatient treatment Type of outpatient treatment: Adult  On Site Evaluation by:   Reviewed with Physician:    Dwan Bolt 10/02/2014 1:05 AM

## 2015-06-25 ENCOUNTER — Emergency Department
Admission: EM | Admit: 2015-06-25 | Discharge: 2015-06-25 | Disposition: A | Discharge status: Home / Self Care | Payer: Medicaid Other | Attending: Emergency Medicine | Admitting: Emergency Medicine

## 2015-06-25 DIAGNOSIS — Z79899 Other long term (current) drug therapy: Secondary | ICD-10-CM | POA: Insufficient documentation

## 2015-06-25 DIAGNOSIS — K120 Recurrent oral aphthae: Secondary | ICD-10-CM | POA: Insufficient documentation

## 2015-06-25 DIAGNOSIS — K1379 Other lesions of oral mucosa: Secondary | ICD-10-CM | POA: Diagnosis present

## 2015-06-25 DIAGNOSIS — I10 Essential (primary) hypertension: Secondary | ICD-10-CM | POA: Diagnosis not present

## 2015-06-25 MED ORDER — MAGIC MOUTHWASH W/LIDOCAINE: 5.0000 mL | Freq: Four times a day (QID) | ORAL | Status: DC

## 2015-06-25 MED ORDER — MAGIC MOUTHWASH
15.0000 mL | Freq: Once | ORAL | Status: AC
  Administered 2015-06-25: 15 mL via ORAL
  Filled 2015-06-25: qty 20

## 2015-06-25 NOTE — ED Provider Notes (Signed)
[facility] Emergency Department Provider Note  ____________________________________________  Time seen: Approximately 9:24 PM  I have reviewed the triage vital signs and the nursing notes.   HISTORY  Chief Complaint Mouth Lesions    HPI Raven Schroeder is a 65 y.o. female patient complaining of painful white patches in her mouth and internal cheek for approximately one month. She rates the pain as a 3/10. She describes the pain as a burning sensation. Patient states pain increase with pressure to the teeth. No palliative measures for this complaint. Patient denies any fever sore throat or URI signs and symptoms.   Past Medical History  Diagnosis Date  . Hypertension     There are no active problems to display for this patient.   Past Surgical History  Procedure Laterality Date  . Tubal ligation    . Cholecystectomy      Current Outpatient Rx  Name  Route  Sig  Dispense  Refill  . HYDROcodone-acetaminophen (NORCO/VICODIN) 5-325 MG per tablet   Oral   Take 1 tablet by mouth every 4 (four) hours as needed for moderate pain.   20 tablet   0   . magic mouthwash w/lidocaine SOLN   Oral   Take 5 mLs by mouth 4 (four) times daily.   100 mL   0   . sulfamethoxazole-trimethoprim (BACTRIM DS,SEPTRA DS) 800-160 MG per tablet   Oral   Take 1 tablet by mouth 2 (two) times daily.   20 tablet   0     Allergies Codeine  No family history on file.  Social History Social History  Substance Use Topics  . Smoking status: Never Smoker   . Smokeless tobacco: Never Used  . Alcohol Use: No    Review of Systems Constitutional: No fever/chills Eyes: No visual changes. ENT: No sore throat. Lesions inside of mouth Cardiovascular: Denies chest pain. Respiratory: Denies shortness of breath. Gastrointestinal: No abdominal pain.  No nausea, no vomiting.  No diarrhea.  No constipation. Genitourinary: Negative for dysuria. Musculoskeletal: Negative  for back pain. Skin: Negative for rash. Neurological: Negative for headaches, focal weakness or numbness. Endocrine: Hypertension Hematological/Lymphatic: Allergic/Immunilogical: Codeine  10-point ROS otherwise negative.  ____________________________________________   PHYSICAL EXAM:  VITAL SIGNS: ED Triage Vitals  Enc Vitals Group     BP 06/25/15 2001 161/49 mmHg     Pulse Rate 06/25/15 2001 80     Resp 06/25/15 2001 18     Temp 06/25/15 2001 97.5 F (36.4 C)     Temp Source 06/25/15 2001 Oral     SpO2 06/25/15 2001 99 %     Weight 06/25/15 2001 160 lb (72.576 kg)     Height 06/25/15 2001  (1.499 m)     Head Cir --      Peak Flow --      Pain Score 06/25/15 2002 3     Pain Loc --      Pain Edu? --      Excl. in GC? --     Constitutional: Alert and oriented. Well appearing and in no acute distress. Eyes: Conjunctivae are normal. PERRL. EOMI. Head: Atraumatic. Nose: No congestion/rhinnorhea. Mouth/Throat: Mucous membranes are moist.  Oropharynx  with erythematous orval lesions Neck: No stridor.  No cervical spine tenderness to palpation. Hematological/Lymphatic/Immunilogical: No cervical lymphadenopathy. Cardiovascular: Normal rate, regular rhythm. Grossly normal heart sounds.  Good peripheral circulation. Respiratory: Normal respiratory effort.  No retractions. Lungs CTAB. Gastrointestinal: Soft and nontender. No distention. No abdominal  bruits. No CVA tenderness. Musculoskeletal: No lower extremity tenderness nor edema.  No joint effusions. Neurologic:  Normal speech and language. No gross focal neurologic deficits are appreciated. No gait instability. Skin:  Skin is warm, dry and intact. No rash noted. Psychiatric: Mood and affect are normal. Speech and behavior are normal.  ____________________________________________   LABS (all labs ordered are listed, but only abnormal results are displayed)  Labs Reviewed - No data to  display ____________________________________________  EKG   ____________________________________________  RADIOLOGY   ____________________________________________   PROCEDURES  Procedure(s) performed: None  Critical Care performed: No  ____________________________________________   INITIAL IMPRESSION / ASSESSMENT AND PLAN / ED COURSE  Pertinent labs & imaging results that were available during my care of the patient were reviewed by me and considered in my medical decision making (see chart for details).  Aphthous ulcers. Patient given discharge Instructions. Patient given prescription for magic mouthwash. ___[facility] _________________________________________   FINAL CLINICAL IMPRESSION(S) / ED DIAGNOSES  Final diagnoses:  Aphthous ulcer of mouth      Joni ReiningRonald K Smith, PA-C 06/25/15 2134  Myrna Blazeravid Matthew Schaevitz, MD 06/26/15 [phone]

## 2015-06-25 NOTE — Discharge Instructions (Signed)
Oral Ulcers °Oral ulcers are painful, shallow sores around the lining of the mouth. They can affect the gums, the inside of the lips, and the cheeks. (Sores on the outside of the lips and on the face are different.) They typically first occur in school-aged children and teenagers. Oral ulcers may also be called canker sores or cold sores. °CAUSES  °Canker sores and cold sores can be caused by many factors including: °· Infection. °· Injury. °· Sun exposure. °· Medications. °· Emotional stress. °· Food allergies. °· Vitamin deficiencies. °· Toothpastes containing sodium lauryl sulfate. °The herpes virus can be the cause of mouth ulcers. The first infection can be severe and cause 10 or more ulcers on the gums, tongue, and lips with fever and difficulty in swallowing. This infection usually occurs between the ages of 1 and 3 years.  °SYMPTOMS  °The typical sore is about ¼ inch (6 mm) in size and is an oval or round ulcer with red borders. °DIAGNOSIS  °Your caregiver can diagnose simple oral ulcers by examination. Additional testing is usually not required.  °TREATMENT  °Treatment is aimed at pain relief. Generally, oral ulcers resolve by themselves within 1 to 2 weeks without medication and are not contagious unless caused by herpes (and other viruses). Antibiotics are not effective with mouth sores. Avoid direct contact with others until the ulcer is completely healed. See your caregiver for follow-up care as recommended. Also: °· Offer a soft diet. °· Encourage plenty of fluids to prevent dehydration. Popsicles and milk shakes can be helpful. °· Avoid acidic and salty foods and drinks such as orange juice. °· Infants and young children will often refuse to drink because of pain. Using a teaspoon, cup, or syringe to give small amounts of fluids frequently can help prevent dehydration. °· Cold compresses on the face may help reduce pain. °· Pain medication can help control soreness. °· A solution of diphenhydramine  mixed with a liquid antacid can be useful to decrease the soreness of ulcers. Consult a caregiver for the dosing. °· Liquids or ointments with a numbing ingredient may be helpful when used as recommended. °· Older children and teenagers can rinse their mouth with a salt-water mixture (1/2 teaspoon of salt in 8 ounces of water) four times a day. This treatment is uncomfortable but may reduce the time the ulcers are present. °· There are many over-the-counter throat lozenges and medications available for oral ulcers. Their effectiveness has not been studied. °· Consult your medical caregiver prior to using homeopathic treatments for oral ulcers. °SEEK MEDICAL CARE IF:  °· You think your child needs to be seen. °· The pain worsens and you cannot control it. °· There are 4 or more ulcers. °· The lips and gums begin to bleed and crust. °· A single mouth ulcer is near a tooth that is causing a toothache or pain. °· Your child has a fever, swollen face, or swollen glands. °· The ulcers began after starting a medication. °· Mouth ulcers keep reoccurring or last more than 2 weeks. °· You think your child is not taking adequate fluids. °SEEK IMMEDIATE MEDICAL CARE IF:  °· Your child has a high fever. °· Your child is unable to swallow or becomes dehydrated. °· Your child looks or acts very ill. °· An ulcer caused by a chemical your child accidentally put in their mouth. °  °This information is not intended to replace advice given to you by your health care provider. Make sure you discuss any   questions you have with your health care provider. °  °Document Released: 04/24/2004 Document Revised: 04/07/2014 Document Reviewed: 08/02/2014 °Elsevier Interactive Patient Education ©2016 Elsevier Inc. ° °

## 2015-06-25 NOTE — ED Notes (Signed)
Pt in with white patches to inside mouth states for over a month and they are painful.

## 2015-08-07 ENCOUNTER — Encounter: Payer: Self-pay | Admitting: Emergency Medicine

## 2015-08-07 ENCOUNTER — Emergency Department
Admission: EM | Admit: 2015-08-07 | Discharge: 2015-08-07 | Disposition: A | Discharge status: Home / Self Care | Payer: Medicaid Other | Attending: Emergency Medicine | Admitting: Emergency Medicine

## 2015-08-07 DIAGNOSIS — F329 Major depressive disorder, single episode, unspecified: Secondary | ICD-10-CM | POA: Diagnosis not present

## 2015-08-07 DIAGNOSIS — I10 Essential (primary) hypertension: Secondary | ICD-10-CM | POA: Insufficient documentation

## 2015-08-07 DIAGNOSIS — Z792 Long term (current) use of antibiotics: Secondary | ICD-10-CM | POA: Diagnosis not present

## 2015-08-07 DIAGNOSIS — Z760 Encounter for issue of repeat prescription: Secondary | ICD-10-CM

## 2015-08-07 HISTORY — DX: Anxiety disorder, unspecified: F41.9

## 2015-08-07 HISTORY — DX: Major depressive disorder, single episode, unspecified: F32.9

## 2015-08-07 HISTORY — DX: Depression, unspecified: F32.A

## 2015-08-07 MED ORDER — AMITRIPTYLINE HCL 100 MG PO TABS: 100.0000 mg | ORAL_TABLET | Freq: Every day | ORAL | Status: DC

## 2015-08-07 MED ORDER — AMLODIPINE BESYLATE 5 MG PO TABS: 5.0000 mg | ORAL_TABLET | Freq: Every day | ORAL | Status: AC

## 2015-08-07 NOTE — ED Provider Notes (Signed)
[facility] Emergency Department Provider Note   ____________________________________________  Time seen: Approximately 3:38 PM  I have reviewed the triage vital signs and the nursing notes.   HISTORY  Chief Complaint Medication Refill    HPI Raven Schroeder is a 65 y.o. female patient requests refill of medication for hypertension and depression. Patient states she's been on medicine for 2 months secondary to unable to afford to pay her doctor. Patient states increased anxiety and depression. Patient unsure of her blood pressure. Patient denies any pain. Plain. No palliative measures taken.   Past Medical History  Diagnosis Date  . Hypertension   . Anxiety   . Depression     There are no active problems to display for this patient.   Past Surgical History  Procedure Laterality Date  . Tubal ligation    . Cholecystectomy      Current Outpatient Rx  Name  Route  Sig  Dispense  Refill  . amitriptyline (ELAVIL) 100 MG tablet   Oral   Take 1 tablet (100 mg total) by mouth at bedtime.   30 tablet   3   . amLODipine (NORVASC) 5 MG tablet   Oral   Take 1 tablet (5 mg total) by mouth daily.   30 tablet   3   . HYDROcodone-acetaminophen (NORCO/VICODIN) 5-325 MG per tablet   Oral   Take 1 tablet by mouth every 4 (four) hours as needed for moderate pain.   20 tablet   0   . magic mouthwash w/lidocaine SOLN   Oral   Take 5 mLs by mouth 4 (four) times daily.   100 mL   0   . sulfamethoxazole-trimethoprim (BACTRIM DS,SEPTRA DS) 800-160 MG per tablet   Oral   Take 1 tablet by mouth 2 (two) times daily.   20 tablet   0     Allergies Codeine  History reviewed. No pertinent family history.  Social History Social History  Substance Use Topics  . Smoking status: Never Smoker   . Smokeless tobacco: Never Used  . Alcohol Use: No    Review of Systems Constitutional: No fever/chills Eyes: No visual changes. ENT: No sore  throat. Cardiovascular: Denies chest pain. Respiratory: Denies shortness of breath. Gastrointestinal: No abdominal pain.  No nausea, no vomiting.  No diarrhea.  No constipation. Genitourinary: Negative for dysuria. Musculoskeletal: Negative for back pain. Skin: Negative for rash. Neurological: Negative for headaches, focal weakness or numbness. Psychiatric:Anxiety/ depression Endocrine: Hypertension   ____________________________________________   PHYSICAL EXAM:  VITAL SIGNS: ED Triage Vitals  Enc Vitals Group     BP 08/07/15 1518 161/74 mmHg     Pulse Rate 08/07/15 1518 70     Resp 08/07/15 1518 18     Temp 08/07/15 1518 98.8 F (37.1 C)     Temp Source 08/07/15 1518 Oral     SpO2 08/07/15 1518 100 %     Weight 08/07/15 1518 160 lb (72.576 kg)     Height 08/07/15 1518 5' (1.524 m)     Head Cir --      Peak Flow --      Pain Score 08/07/15 1522 0     Pain Loc --      Pain Edu? --      Excl. in GC? --     Constitutional: Alert and oriented. Well appearing and in no acute distress. Eyes: Conjunctivae are normal. PERRL. EOMI. Head: Atraumatic. Nose: No congestion/rhinnorhea. Mouth/Throat: Mucous membranes are moist.  Oropharynx non-erythematous. Neck: No stridor.  No cervical spine tenderness to palpation. Cardiovascular: Normal rate, regular rhythm. Grossly normal heart sounds.  Good peripheral circulation.Elevated blood pressure Respiratory: Normal respiratory effort.  No retractions. Lungs CTAB. Gastrointestinal: Soft and nontender. No distention. No abdominal bruits. No CVA tenderness. Musculoskeletal: No lower extremity tenderness nor edema.  No joint effusions. Neurologic:  Normal speech and language. No gross focal neurologic deficits are appreciated. No gait instability. Skin:  Skin is warm, dry and intact. No rash noted. Psychiatric: Mood and affect are normal. Speech and behavior are normal.  ____________________________________________   LABS (all labs  ordered are listed, but only abnormal results are displayed)  Labs Reviewed - No data to display ____________________________________________  EKG   ____________________________________________  RADIOLOGY   ____________________________________________   PROCEDURES  Procedure(s) performed: None  Critical Care performed: No  ____________________________________________   INITIAL IMPRESSION / ASSESSMENT AND PLAN / ED COURSE  Pertinent labs & imaging results that were available during my care of the patient were reviewed by me and considered in my medical decision making (see chart for details).  Medication refill for hypertension and anxiety. [facility]. Patient given a prescription for Norvasc and amitriptyline. ____________________________________________   FINAL CLINICAL IMPRESSION(S) / ED DIAGNOSES  Final diagnoses:  Encounter for medication refill      NEW MEDICATIONS STARTED DURING THIS VISIT:  New Prescriptions   AMITRIPTYLINE (ELAVIL) 100 MG TABLET    Take 1 tablet (100 mg total) by mouth at bedtime.   AMLODIPINE (NORVASC) 5 MG TABLET    Take 1 tablet (5 mg total) by mouth daily.     Note:  This document was prepared using Dragon voice recognition software and may include unintentional dictation errors.    Joni ReiningRonald K Keilany Burnette, PA-C 08/07/15 1630  Emily FilbertJonathan E Williams, MD 08/08/15 2028

## 2015-08-07 NOTE — ED Notes (Addendum)
Pt to ed with c/o needing refill on elavil for anxiety/depression. Pt reports hx of same,.  States has not had meds for 2 months because she can't pay her bill at [facility].  Denies si, denies hi, denies hallucinations, only reports needs refill on meds.

## 2015-08-07 NOTE — ED Notes (Signed)
See triage note.

## 2015-08-07 NOTE — Discharge Instructions (Signed)
Medicine Refill at the Emergency Department  We have refilled your medicine today, but it is best for you to get refills through your primary health care provider's office. In the future, please plan ahead so you do not need to get refills from the emergency department.  If the medicine we refilled was a maintenance medicine, you may have received only enough to get you by until you are able to see your regular health care provider.     This information is not intended to replace advice given to you by your health care provider. Make sure you discuss any questions you have with your health care provider.     Document Released: 07/04/2003 Document Revised: 04/07/2014 Document Reviewed: 06/24/2013  Elsevier Interactive Patient Education 2016 Elsevier Inc.

## 2015-08-07 NOTE — ED Notes (Signed)
Discussed discharge instructions, prescriptions, and follow-up care with patient. No questions or concerns at this time. Pt stable at discharge.  

## 2016-05-05 ENCOUNTER — Emergency Department
Admission: EM | Admit: 2016-05-05 | Discharge: 2016-05-05 | Disposition: A | Discharge status: Home / Self Care | Payer: Medicaid Other | Attending: Emergency Medicine | Admitting: Emergency Medicine

## 2016-05-05 DIAGNOSIS — X58XXXA Exposure to other specified factors, initial encounter: Secondary | ICD-10-CM | POA: Insufficient documentation

## 2016-05-05 DIAGNOSIS — Y939 Activity, unspecified: Secondary | ICD-10-CM | POA: Insufficient documentation

## 2016-05-05 DIAGNOSIS — Y929 Unspecified place or not applicable: Secondary | ICD-10-CM | POA: Diagnosis not present

## 2016-05-05 DIAGNOSIS — Z79899 Other long term (current) drug therapy: Secondary | ICD-10-CM | POA: Insufficient documentation

## 2016-05-05 DIAGNOSIS — I10 Essential (primary) hypertension: Secondary | ICD-10-CM | POA: Diagnosis not present

## 2016-05-05 DIAGNOSIS — Y999 Unspecified external cause status: Secondary | ICD-10-CM | POA: Insufficient documentation

## 2016-05-05 DIAGNOSIS — S161XXA Strain of muscle, fascia and tendon at neck level, initial encounter: Secondary | ICD-10-CM | POA: Insufficient documentation

## 2016-05-05 DIAGNOSIS — S199XXA Unspecified injury of neck, initial encounter: Secondary | ICD-10-CM | POA: Diagnosis present

## 2016-05-05 MED ORDER — LIDOCAINE 5 % EX PTCH
1.0000 | MEDICATED_PATCH | CUTANEOUS | Status: DC
  Administered 2016-05-05: 1 via TRANSDERMAL
  Filled 2016-05-05: qty 1

## 2016-05-05 MED ORDER — AMITRIPTYLINE HCL 100 MG PO TABS: 100.0000 mg | ORAL_TABLET | Freq: Every day | ORAL | 0 refills | Status: AC

## 2016-05-05 MED ORDER — LIDOCAINE 5 % EX PTCH: 1.0000 | MEDICATED_PATCH | CUTANEOUS | 0 refills | Status: AC

## 2016-05-05 MED ORDER — MELOXICAM 15 MG PO TABS: 15.0000 mg | ORAL_TABLET | Freq: Every day | ORAL | 0 refills | Status: AC

## 2016-05-05 NOTE — ED Notes (Signed)
See triage note  States she developed some discomfort to neck about 2 weeks ago  Denies any injury or fever  Also states she ran out of elavil or norvasc

## 2016-05-05 NOTE — ED Triage Notes (Signed)
Pt started with neck pain 2+ weeks ago - no other symptoms She is also requested medication refill of Amitriptyline

## 2016-05-05 NOTE — ED Provider Notes (Signed)
[facility] Emergency Department Provider Note  ____________________________________________  Time seen: Approximately 5:57 PM  I have reviewed the triage vital signs and the nursing notes.   HISTORY  Chief Complaint Medication Refill and Neck Pain    HPI Raven LucksRoberta A Ephriam KnucklesChristian is a 66 y.o. female that presents to the emergency department with left-sided neck pain for 2 weeks. Patient states that it hurts in her upper back and on the left side of the back of her neck. She denies any trauma. Patient states that she was prescribed amitriptyline in the emergency department for anxiety and depression. Patient states that this helped significantly with her depression and she is out of medication. She denies any side effects from medication. Patient was told that she can come back to the emergency department for refills on this medication. Patient has not been able to see a primary care provider due to finances. Patient states that she has been under a lot of stress recently, which she thinks is contributing to her neck pain. Patient denies any suicidal or homicidal ideations. Patient denies shortness of breath, chest pain, nausea, vomiting, abdominal pain, numbness, tingling.   Past Medical History:  Diagnosis Date  . Anxiety   . Depression   . Hypertension     There are no active problems to display for this patient.   Past Surgical History:  Procedure Laterality Date  . CHOLECYSTECTOMY    . TUBAL LIGATION      Prior to Admission medications   Medication Sig Start Date End Date Taking? Authorizing Provider  amitriptyline (ELAVIL) 100 MG tablet Take 1 tablet (100 mg total) by mouth at bedtime. 05/05/16   Enid DerryAshley Hobart Marte, PA-C  amLODipine (NORVASC) 5 MG tablet Take 1 tablet (5 mg total) by mouth daily. 08/07/15   Joni Reiningonald K Smith, PA-C  lidocaine (LIDODERM) 5 % Place 1 patch onto the skin daily. Remove & Discard patch within 12 hours or as directed by MD 05/05/16 05/05/17   Enid DerryAshley Chelesa Weingartner, PA-C  meloxicam (MOBIC) 15 MG tablet Take 1 tablet (15 mg total) by mouth daily. 05/05/16 05/15/16  Enid DerryAshley Makayle Krahn, PA-C    Allergies Codeine  No family history on file.  Social History Social History  Substance Use Topics  . Smoking status: Never Smoker  . Smokeless tobacco: Never Used  . Alcohol use No     Review of Systems  Constitutional: No fever/chills ENT: No upper respiratory complaints. Cardiovascular: No chest pain. Respiratory:  No SOB. Gastrointestinal: No abdominal pain.  No nausea, no vomiting.  Skin: Negative for rash, abrasions, lacerations, ecchymosis. Neurological: Negative for headaches, numbness or tingling   ____________________________________________   PHYSICAL EXAM:  VITAL SIGNS: ED Triage Vitals  Enc Vitals Group     BP 05/05/16 1635 (!) 164/72     Pulse Rate 05/05/16 1635 77     Resp 05/05/16 1635 16     Temp 05/05/16 1635 97.8 F (36.6 C)     Temp Source 05/05/16 1635 Oral     SpO2 05/05/16 1635 98 %     Weight 05/05/16 1636 165 lb (74.8 kg)     Height 05/05/16 1636 5' (1.524 m)     Head Circumference --      Peak Flow --      Pain Score 05/05/16 1636 3     Pain Loc --      Pain Edu? --      Excl. in GC? --      Constitutional: Alert and oriented.  Well appearing and in no acute distress. Eyes: Conjunctivae are normal. PERRL. EOMI. Head: Atraumatic. ENT:      Ears:      Nose: No congestion/rhinnorhea.      Mouth/Throat: Mucous membranes are moist.  Neck: No stridor. No cervical spine tenderness to palpation. Tenderness to palpation over left trapezius muscle. Full range of motion of neck. Cardiovascular: Normal rate, regular rhythm.  Good peripheral circulation. Respiratory: Normal respiratory effort without tachypnea or retractions. Lungs CTAB. Good air entry to the bases with no decreased or absent breath sounds. Gastrointestinal: Bowel sounds 4 quadrants. Soft and nontender to palpation. No guarding or rigidity.  No palpable masses. No distention. Musculoskeletal: Full range of motion to all extremities. No gross deformities appreciated. Neurologic:  Normal speech and language. No gross focal neurologic deficits are appreciated.  Skin:  Skin is warm, dry and intact. No rash noted. Psychiatric: Mood and affect are normal. Speech and behavior are normal. Patient exhibits appropriate insight and judgement.   ____________________________________________   LABS (all labs ordered are listed, but only abnormal results are displayed)  Labs Reviewed - No data to display ____________________________________________  EKG   ____________________________________________  RADIOLOGY   No results found.  ____________________________________________    PROCEDURES  Procedure(s) performed:    Procedures    Medications  lidocaine (LIDODERM) 5 % 1 patch (not administered)     ____________________________________________   INITIAL IMPRESSION / ASSESSMENT AND PLAN / ED COURSE  Pertinent labs & imaging results that were available during my care of the patient were reviewed by me and considered in my medical decision making (see chart for details).  Review of the [place] CSRS was performed in accordance of the [facility] prior to dispensing any controlled drugs.     Patient's diagnosis is consistent with neck strain and is here today for amitriptyline refill. Exam and vital signs are reassuring. Patient is tender to palpation over left trapezius muscle. No tenderness to palpation over cervical spine. No trauma. Patient states she has been under a lot of stress recently and is tossing and turning at night, which is likely leading to neck pain. I discussed with patient that Emergency Department cannot continue refilling amitriptyline and I will give her a very short supply until she can follow up with a primary care provider. Patient denies any suicidal or homicidal ideations. Patient will be discharged home  with prescriptions for meloxicam, lidocaine patch, amitriptyline. Patient is to follow up with PCP as directed. Patient is given ED precautions to return to the ED for any worsening or new symptoms.     ____________________________________________  FINAL CLINICAL IMPRESSION(S) / ED DIAGNOSES  Final diagnoses:  Strain of neck muscle, initial encounter      NEW MEDICATIONS STARTED DURING THIS VISIT:  New Prescriptions   AMITRIPTYLINE (ELAVIL) 100 MG TABLET    Take 1 tablet (100 mg total) by mouth at bedtime.   LIDOCAINE (LIDODERM) 5 %    Place 1 patch onto the skin daily. Remove & Discard patch within 12 hours or as directed by MD   MELOXICAM (MOBIC) 15 MG TABLET    Take 1 tablet (15 mg total) by mouth daily.        This chart was dictated using voice recognition software/Dragon. Despite best efforts to proofread, errors can occur which can change the meaning. Any change was purely unintentional.    Enid Derry, PA-C 05/05/16 2318    Arnaldo Natal, MD 05/05/16 [phone]

## 2017-07-29 ENCOUNTER — Ambulatory Visit: Payer: Self-pay | Admitting: Physician Assistant
